# Patient Record
Sex: Female | Born: 2000 | Race: Black or African American | Hispanic: No | Marital: Single | Smoking: Never smoker
Health system: Southern US, Community
[De-identification: ages and names within clinical notes are randomized; demographics above are authoritative.]

## PROBLEM LIST (undated history)

## (undated) DIAGNOSIS — J45909 Unspecified asthma, uncomplicated: Secondary | ICD-10-CM

---
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
## 2021-06-22 ENCOUNTER — Ambulatory Visit: Payer: Medicaid Other

## 2021-12-28 ENCOUNTER — Other Ambulatory Visit: Payer: Self-pay

## 2021-12-28 ENCOUNTER — Emergency Department
Admission: EM | Admit: 2021-12-28 | Discharge: 2021-12-28 | Disposition: A | Discharge status: Home / Self Care | Payer: Medicaid Other | Attending: Emergency Medicine | Admitting: Emergency Medicine

## 2021-12-28 ENCOUNTER — Encounter: Payer: Self-pay | Admitting: Emergency Medicine

## 2021-12-28 DIAGNOSIS — N764 Abscess of vulva: Secondary | ICD-10-CM | POA: Diagnosis not present

## 2021-12-28 DIAGNOSIS — L0291 Cutaneous abscess, unspecified: Secondary | ICD-10-CM

## 2021-12-28 MED ORDER — DOXYCYCLINE MONOHYDRATE 100 MG PO TABS: 100.0000 mg | ORAL_TABLET | Freq: Two times a day (BID) | ORAL | 0 refills | Status: DC

## 2021-12-28 MED ORDER — CEPHALEXIN 500 MG PO CAPS: 500.0000 mg | ORAL_CAPSULE | Freq: Four times a day (QID) | ORAL | 0 refills | Status: AC

## 2021-12-28 MED ORDER — LIDOCAINE-EPINEPHRINE 2 %-1:100000 IJ SOLN
20.0000 mL | Freq: Once | INTRAMUSCULAR | Status: AC
  Administered 2021-12-28: 20 mL via INTRADERMAL
  Filled 2021-12-28: qty 1

## 2021-12-28 NOTE — Discharge Instructions (Addendum)
The wick should be removed in 2 days as we discussed.  Please take the antibiotics as prescribed.  Please return for any new, worsening, or change in symptoms or other concerns.

## 2021-12-28 NOTE — ED Provider Triage Note (Signed)
Emergency Medicine Provider Triage Evaluation Note  SMERA GUYETTE , a 21 y.o. female  was evaluated in triage.  Pt complains of groin abscess.  Review of Systems  Positive: Abscess Negative: Fever  Physical Exam  There were no vitals taken for this visit. Gen:   Awake, no distress   Resp:  Normal effort  MSK:   Moves extremities without difficulty  Other:    Medical Decision Making  Medically screening exam initiated at 12:59 PM.  Appropriate orders placed.  SINDEE STUCKER was informed that the remainder of the evaluation will be completed by another provider, this initial triage assessment does not replace that evaluation, and the importance of remaining in the ED until their evaluation is complete.  Will need I&D   Jene Every, MD 12/28/21 1527

## 2021-12-28 NOTE — ED Triage Notes (Signed)
Pt via POV from home. Pt c/o abscess on L thigh states she noticed it 2 days ago but started hurting today. Denies fever.

## 2021-12-28 NOTE — ED Provider Notes (Signed)
[facility] Provider Note    Event Date/Time   First MD Initiated Contact with Patient 12/28/21 1353     (approximate)   History   Abscess   HPI  Susan Maddox is a 21 y.o. female who presents today for evaluation of abscess.  Patient reports that she had an ingrown hair in her bikini line which she tried to shave and it has since gotten infected.  She has been applying warm compresses and it has come to a head.  She denies fevers or chills.     Physical Exam   Triage Vital Signs: ED Triage Vitals  Enc Vitals Group     BP 12/28/21 1302 (!) 149/72     Pulse Rate 12/28/21 1302 (!) 106     Resp 12/28/21 1302 18     Temp 12/28/21 1302 98.5 F (36.9 C)     Temp src --      SpO2 12/28/21 1302 100 %     Weight --      Height 12/28/21 1300 5\' 7"  (1.702 m)     Head Circumference --      Peak Flow --      Pain Score 12/28/21 1300 8     Pain Loc --      Pain Edu? --      Excl. in GC? --     Most recent vital signs: Vitals:   12/28/21 1446 12/28/21 1501  BP: 140/70 129/75  Pulse: 98 87  Resp: 18 16  Temp:    SpO2: 100% 99%    Physical Exam Vitals and nursing note reviewed.  Constitutional:      General: Awake and alert. No acute distress.    Appearance: Normal appearance. She is well-developed and normal weight.  HENT:     Head: Normocephalic and atraumatic.     Mouth: Mucous membranes are moist.  Eyes:     General: PERRL. Normal EOMs        Right eye: No discharge.        Left eye: No discharge.     Conjunctiva/sclera: Conjunctivae normal.  Cardiovascular:     Rate and Rhythm: Normal rate and regular rhythm.     Pulses: Normal pulses.  Pulmonary:     Effort: Pulmonary effort is normal. No respiratory distress.  Abdominal:     Abdomen is soft. There is no abdominal tenderness.  Musculoskeletal:        General: No swelling. Normal range of motion.     Cervical back: Normal range of motion and neck supple.  Skin:    General:  Skin is warm and dry.     Capillary Refill: Capillary refill takes less than 2 seconds.     Findings: No rash.  Left groin area: Normal external genitalia.  Shaved pubic hair. Outside left labia with 1x1cm area of fluctuance and whitened head.  Neurological:     Mental Status: She is alert.      ED Results / Procedures / Treatments   Labs (all labs ordered are listed, but only abnormal results are displayed) Labs Reviewed - No data to display   EKG     RADIOLOGY     PROCEDURES:  Critical Care performed:   06/22/23Marland KitchenIncision and Drainage  Date/Time: 12/28/2021 2:30 PM  Performed by: 12/30/2021, PA-C Authorized by: Jackelyn Hoehn, PA-C   Consent:    Consent obtained:  Verbal   Consent given by:  Patient   Risks,  benefits, and alternatives were discussed: yes     Risks discussed:  Bleeding, damage to other organs, incomplete drainage, infection and pain   Alternatives discussed:  No treatment Universal protocol:    Procedure explained and questions answered to patient or proxy's satisfaction: yes     Relevant documents present and verified: yes     Test results available : yes     Required blood products, implants, devices, and special equipment available: yes     Site/side marked: yes     Immediately prior to procedure, a time out was called: yes     Patient identity confirmed:  Verbally with patient Location:    Type:  Abscess   Size:  2x1cm   Location:  Anogenital   Anogenital location:  Vulva Pre-procedure details:    Skin preparation:  Antiseptic wash Sedation:    Sedation type:  None Anesthesia:    Anesthesia method:  Local infiltration   Local anesthetic:  Lidocaine 1% WITH epi Procedure type:    Complexity:  Simple Procedure details:    Ultrasound guidance: no     Incision types:  Single straight   Incision depth:  Dermal   Wound management:  Probed and deloculated and irrigated with saline   Drainage:  Purulent   Drainage amount:  Copious    Wound treatment:  Drain placed   Packing materials:  1/4 in iodoform gauze Post-procedure details:    Procedure completion:  Tolerated well, no immediate complications    MEDICATIONS ORDERED IN ED: Medications  lidocaine-EPINEPHrine (XYLOCAINE W/EPI) 2 %-1:100000 (with pres) injection 20 mL (20 mLs Intradermal Given by Other 12/28/21 1404)     IMPRESSION / MDM / ASSESSMENT AND PLAN / ED COURSE  I reviewed the triage vital signs and the nursing notes.   Differential diagnosis includes, but is not limited to, folliculitis, furuncle, carbuncle, abscess. Not at the bartholin gland. Patient is awake and alert, hemodynamically stable and afebrile.  Area appears to have begun as an ingrown hair and turned into abscess.  It is outside the labia, not consistent with Bartholin's cyst/abscess.  Area was anesthetized with lidocaine and opened with an 11 blade.  Copious purulent discharge noted.  Pocket was deloculated and irrigated.  Small wick was placed.  We discussed with removal in 2 days and outpatient follow-up for wound recheck.  She was started on antibiotics.  We discussed tricked return precautions and the importance of close outpatient follow-up.  Patient understands and agrees with plan.  Discharged in stable condition.   Patient's presentation is most consistent with acute illness / injury with system symptoms.    FINAL CLINICAL IMPRESSION(S) / ED DIAGNOSES   Final diagnoses:  Abscess     Rx / DC Orders   ED Discharge Orders          Ordered    cephALEXin (KEFLEX) 500 MG capsule  4 times daily        12/28/21 1422    doxycycline (ADOXA) 100 MG tablet  2 times daily        12/28/21 1422             Note:  This document was prepared using Dragon voice recognition software and may include unintentional dictation errors.   Susan Maddox 12/28/21 1910    Chesley Noon, MD 12/29/21 [phone]

## 2023-04-22 ENCOUNTER — Inpatient Hospital Stay
Admission: EM | Admit: 2023-04-22 | Discharge: 2023-04-23 | DRG: 203 | Disposition: A | Discharge status: Home / Self Care | Payer: 59 | Attending: Internal Medicine | Admitting: Internal Medicine

## 2023-04-22 ENCOUNTER — Emergency Department: Payer: 59

## 2023-04-22 ENCOUNTER — Other Ambulatory Visit: Payer: Self-pay

## 2023-04-22 DIAGNOSIS — J4541 Moderate persistent asthma with (acute) exacerbation: Secondary | ICD-10-CM | POA: Diagnosis not present

## 2023-04-22 DIAGNOSIS — R1110 Vomiting, unspecified: Secondary | ICD-10-CM | POA: Diagnosis present

## 2023-04-22 DIAGNOSIS — F17290 Nicotine dependence, other tobacco product, uncomplicated: Secondary | ICD-10-CM | POA: Diagnosis present

## 2023-04-22 DIAGNOSIS — Z79899 Other long term (current) drug therapy: Secondary | ICD-10-CM

## 2023-04-22 DIAGNOSIS — Z9109 Other allergy status, other than to drugs and biological substances: Secondary | ICD-10-CM | POA: Insufficient documentation

## 2023-04-22 DIAGNOSIS — Z1152 Encounter for screening for COVID-19: Secondary | ICD-10-CM | POA: Diagnosis not present

## 2023-04-22 DIAGNOSIS — R0602 Shortness of breath: Secondary | ICD-10-CM | POA: Diagnosis not present

## 2023-04-22 DIAGNOSIS — R079 Chest pain, unspecified: Secondary | ICD-10-CM | POA: Diagnosis not present

## 2023-04-22 DIAGNOSIS — J209 Acute bronchitis, unspecified: Secondary | ICD-10-CM | POA: Diagnosis not present

## 2023-04-22 DIAGNOSIS — E876 Hypokalemia: Secondary | ICD-10-CM | POA: Diagnosis not present

## 2023-04-22 DIAGNOSIS — J45901 Unspecified asthma with (acute) exacerbation: Principal | ICD-10-CM | POA: Diagnosis present

## 2023-04-22 LAB — CBC WITH DIFFERENTIAL/PLATELET
Abs Immature Granulocytes: 0.05 10*3/uL (ref 0.00–0.07)
Basophils Absolute: 0.1 10*3/uL (ref 0.0–0.1)
Basophils Relative: 1 %
Eosinophils Absolute: 0.2 10*3/uL (ref 0.0–0.5)
Eosinophils Relative: 1 %
HCT: 42.3 % (ref 36.0–46.0)
Hemoglobin: 13.5 g/dL (ref 12.0–15.0)
Immature Granulocytes: 0 %
Lymphocytes Relative: 14 %
Lymphs Abs: 2 10*3/uL (ref 0.7–4.0)
MCH: 27.4 pg (ref 26.0–34.0)
MCHC: 31.9 g/dL (ref 30.0–36.0)
MCV: 86 fL (ref 80.0–100.0)
Monocytes Absolute: 1.1 10*3/uL — ABNORMAL HIGH (ref 0.1–1.0)
Monocytes Relative: 7 %
Neutro Abs: 10.9 10*3/uL — ABNORMAL HIGH (ref 1.7–7.7)
Neutrophils Relative %: 77 %
Platelets: 285 10*3/uL (ref 150–400)
RBC: 4.92 MIL/uL (ref 3.87–5.11)
RDW: 13.3 % (ref 11.5–15.5)
WBC: 14.3 10*3/uL — ABNORMAL HIGH (ref 4.0–10.5)
nRBC: 0 % (ref 0.0–0.2)

## 2023-04-22 LAB — BASIC METABOLIC PANEL
Anion gap: 12 (ref 5–15)
BUN: 12 mg/dL (ref 6–20)
CO2: 24 mmol/L (ref 22–32)
Calcium: 8.9 mg/dL (ref 8.9–10.3)
Chloride: 103 mmol/L (ref 98–111)
Creatinine, Ser: 0.85 mg/dL (ref 0.44–1.00)
GFR, Estimated: >60 mL/min (ref >60)
Glucose, Bld: 127 mg/dL — ABNORMAL HIGH (ref 70–99)
Potassium: 3 mmol/L — ABNORMAL LOW (ref 3.5–5.1)
Sodium: 139 mmol/L (ref 135–145)

## 2023-04-22 LAB — RESP PANEL BY RT-PCR (RSV, FLU A&B, COVID)  RVPGX2
Influenza A by PCR: NEGATIVE
Influenza B by PCR: NEGATIVE
Resp Syncytial Virus by PCR: NEGATIVE
SARS Coronavirus 2 by RT PCR: NEGATIVE

## 2023-04-22 LAB — POC URINE PREG, ED: Preg Test, Ur: NEGATIVE

## 2023-04-22 LAB — TROPONIN I (HIGH SENSITIVITY): Troponin I (High Sensitivity): 5 ng/L (ref <18)

## 2023-04-22 MED ORDER — POTASSIUM CHLORIDE CRYS ER 20 MEQ PO TBCR
40.0000 meq | EXTENDED_RELEASE_TABLET | Freq: Once | ORAL | Status: AC
  Administered 2023-04-22: 40 meq via ORAL
  Filled 2023-04-22: qty 2

## 2023-04-22 MED ORDER — IPRATROPIUM-ALBUTEROL 0.5-2.5 (3) MG/3ML IN SOLN
3.0000 mL | Freq: Once | RESPIRATORY_TRACT | Status: AC
  Administered 2023-04-22: 3 mL via RESPIRATORY_TRACT
  Filled 2023-04-22: qty 3

## 2023-04-22 MED ORDER — IBUPROFEN 400 MG PO TABS
400.0000 mg | ORAL_TABLET | Freq: Once | ORAL | Status: AC
Start: 2023-04-22 — End: 2023-04-22
  Administered 2023-04-22: 400 mg via ORAL
  Filled 2023-04-22: qty 1

## 2023-04-22 NOTE — ED Provider Notes (Signed)
[facility] Provider Note    Event Date/Time   First MD Initiated Contact with Patient 04/22/23 2059     (approximate)   History   Chief Complaint Respiratory Distress   HPI  Susan Maddox is a 22 y.o. female with past medical history of asthma who presents to the ED complaining of shortness of breath.  Patient reports that her asthma has been "acting up" for the past couple of days with intermittent cough and difficulty breathing.  Patient reports that her breathing got especially bad this evening while she was at work.  EMS was called and reportedly found patient in respiratory distress with oxygen saturations in the 70s.  She was given 3 albuterol treatments, 2 DuoNebs, 125 mg IV Solu-Medrol, 2 g IV magnesium, and 4 mg IV Zofran.  She now reports that her breathing is significantly better, does report some mild ongoing difficulty breathing with tightness in her chest.  She denies any fevers and has not had any pain or swelling in her legs.     Physical Exam   Triage Vital Signs: ED Triage Vitals  Encounter Vitals Group     BP 04/22/23 2045 126/74     Systolic BP Percentile --      Diastolic BP Percentile --      Pulse Rate 04/22/23 2045 (!) 101     Resp 04/22/23 2045 16     Temp 04/22/23 2045 (!) 97.5 F (36.4 C)     Temp Source 04/22/23 2045 Oral     SpO2 04/22/23 2045 93 %     Weight 04/22/23 2047 190 lb 6.4 oz (86.4 kg)     Height 04/22/23 2047 5\' 7"  (1.702 m)     Head Circumference --      Peak Flow --      Pain Score 04/22/23 2046 8     Pain Loc --      Pain Education --      Exclude from Growth Chart --     Most recent vital signs: Vitals:   04/22/23 2050 04/22/23 2130  BP:  131/77  Pulse: 97 (!) 102  Resp: 17 (!) 21  Temp:    SpO2: 93% 95%    Constitutional: Alert and oriented. Eyes: Conjunctivae are normal. Head: Atraumatic. Nose: No congestion/rhinnorhea. Mouth/Throat: Mucous membranes are moist.  Cardiovascular:  Normal rate, regular rhythm. Grossly normal heart sounds.  2+ radial pulses bilaterally. Respiratory: Normal respiratory effort.  No retractions. Lungs with inspiratory expiratory wheezing throughout. Gastrointestinal: Soft and nontender. No distention. Musculoskeletal: No lower extremity tenderness nor edema.  Neurologic:  Normal speech and language. No gross focal neurologic deficits are appreciated.    ED Results / Procedures / Treatments   Labs (all labs ordered are listed, but only abnormal results are displayed) Labs Reviewed  CBC WITH DIFFERENTIAL/PLATELET - Abnormal; Notable for the following components:      Result Value   WBC 14.3 (*)    Neutro Abs 10.9 (*)    Monocytes Absolute 1.1 (*)    All other components within normal limits  BASIC METABOLIC PANEL - Abnormal; Notable for the following components:   Potassium 3.0 (*)    Glucose, Bld 127 (*)    All other components within normal limits  POC URINE PREG, ED  TROPONIN I (HIGH SENSITIVITY)     EKG  ED ECG REPORT I, Chesley Noon, the attending physician, personally viewed and interpreted this ECG.   Date: 04/22/2023  EKG Time:  20:55  Rate: 101  Rhythm: sinus tachycardia  Axis: Normal  Intervals:none  ST&T Change: None  RADIOLOGY Chest x-ray reviewed and interpreted by me with no infiltrate, edema, or effusion.  PROCEDURES:  Critical Care performed: No  Procedures   MEDICATIONS ORDERED IN ED: Medications  ibuprofen (ADVIL) tablet 400 mg (has no administration in time range)  ipratropium-albuterol (DUONEB) 0.5-2.5 (3) MG/3ML nebulizer solution 3 mL (has no administration in time range)  potassium chloride SA (KLOR-CON M) CR tablet 40 mEq (40 mEq Oral Given 04/22/23 2227)  ipratropium-albuterol (DUONEB) 0.5-2.5 (3) MG/3ML nebulizer solution 3 mL (3 mLs Nebulization Given 04/22/23 2228)     IMPRESSION / MDM / ASSESSMENT AND PLAN / ED COURSE  I reviewed the triage vital signs and the nursing notes.                               22 y.o. female with past medical history of asthma presents to the ED with increasing difficulty breathing and chest tightness this evening.  Patient's presentation is most consistent with acute presentation with potential threat to life or bodily function.  Differential diagnosis includes, but is not limited to, ACS, PE, pneumonia, pneumothorax, asthma exacerbation, anemia, electrolyte abnormality, AKI.  Patient well-appearing and in no acute distress, vital signs are reassuring on arrival.  She reportedly had low oxygen saturations with EMS, currently maintaining oxygen saturations on room air here in the ED.  She does have ongoing wheezing but no increased work of breathing.  We will further assess with chest x-ray and labs, EKG shows no evidence of arrhythmia or ischemia.  Labs are reassuring with mild leukocytosis but no significant anemia, electrolyte abnormality, or AKI.  Troponin within normal limits and I doubt ACS or PE.  Chest x-ray unremarkable, but patient has significant recurrent wheezing on reassessment.  She was given a DuoNeb with some improvement but continues to have significant wheezing with increased work of breathing and mild tachypnea.  We will give additional albuterol, case discussed with hospitalist for admission.      FINAL CLINICAL IMPRESSION(S) / ED DIAGNOSES   Final diagnoses:  Exacerbation of persistent asthma, unspecified asthma severity     Rx / DC Orders   ED Discharge Orders     None        Note:  This document was prepared using Dragon voice recognition software and may include unintentional dictation errors.   Chesley Noon, MD 04/22/23 [phone]

## 2023-04-22 NOTE — ED Triage Notes (Signed)
Patient arrives from home by [facility] with respiratory distress. EMS reports room air saturation in the 70's with expiratory wheezes and retractions.  Patient has received 3 albuterol treatments, 2 duo-nebs, 125mg  solumedrol, 2G Magnesium, and 4mg  zofran.  Patient did 2 treatments at home prior to EMS.  Patient last smoke black&mild @ 1600 today.

## 2023-04-23 DIAGNOSIS — Z9109 Other allergy status, other than to drugs and biological substances: Secondary | ICD-10-CM | POA: Insufficient documentation

## 2023-04-23 DIAGNOSIS — J45901 Unspecified asthma with (acute) exacerbation: Secondary | ICD-10-CM

## 2023-04-23 DIAGNOSIS — E876 Hypokalemia: Secondary | ICD-10-CM

## 2023-04-23 DIAGNOSIS — R1110 Vomiting, unspecified: Secondary | ICD-10-CM

## 2023-04-23 DIAGNOSIS — F17290 Nicotine dependence, other tobacco product, uncomplicated: Secondary | ICD-10-CM | POA: Insufficient documentation

## 2023-04-23 LAB — BASIC METABOLIC PANEL
Anion gap: 9 (ref 5–15)
BUN: 11 mg/dL (ref 6–20)
CO2: 25 mmol/L (ref 22–32)
Calcium: 9.2 mg/dL (ref 8.9–10.3)
Chloride: 102 mmol/L (ref 98–111)
Creatinine, Ser: 0.93 mg/dL (ref 0.44–1.00)
GFR, Estimated: >60 mL/min (ref >60)
Glucose, Bld: 167 mg/dL — ABNORMAL HIGH (ref 70–99)
Potassium: 4.2 mmol/L (ref 3.5–5.1)
Sodium: 136 mmol/L (ref 135–145)

## 2023-04-23 LAB — CBC
HCT: 41.4 % (ref 36.0–46.0)
Hemoglobin: 13.4 g/dL (ref 12.0–15.0)
MCH: 27.4 pg (ref 26.0–34.0)
MCHC: 32.4 g/dL (ref 30.0–36.0)
MCV: 84.7 fL (ref 80.0–100.0)
Platelets: 303 10*3/uL (ref 150–400)
RBC: 4.89 MIL/uL (ref 3.87–5.11)
RDW: 13.4 % (ref 11.5–15.5)
WBC: 8.8 10*3/uL (ref 4.0–10.5)
nRBC: 0 % (ref 0.0–0.2)

## 2023-04-23 LAB — HIV ANTIBODY (ROUTINE TESTING W REFLEX): HIV Screen 4th Generation wRfx: NONREACTIVE

## 2023-04-23 LAB — POTASSIUM: Potassium: 4.3 mmol/L (ref 3.5–5.1)

## 2023-04-23 MED ORDER — GUAIFENESIN ER 600 MG PO TB12
600.0000 mg | ORAL_TABLET | Freq: Two times a day (BID) | ORAL | Status: DC
  Administered 2023-04-23 (×2): 600 mg via ORAL
  Filled 2023-04-23 (×2): qty 1

## 2023-04-23 MED ORDER — ONDANSETRON HCL 4 MG/2ML IJ SOLN
4.0000 mg | Freq: Four times a day (QID) | INTRAMUSCULAR | Status: DC | PRN
  Administered 2023-04-23: 4 mg via INTRAVENOUS
  Filled 2023-04-23: qty 2

## 2023-04-23 MED ORDER — PREDNISONE 20 MG PO TABS: 40.0000 mg | ORAL_TABLET | Freq: Every day | ORAL | 0 refills | Status: DC

## 2023-04-23 MED ORDER — IPRATROPIUM-ALBUTEROL 0.5-2.5 (3) MG/3ML IN SOLN
3.0000 mL | Freq: Four times a day (QID) | RESPIRATORY_TRACT | Status: DC
Start: 2023-04-23 — End: 2023-04-23
  Administered 2023-04-23: 3 mL via RESPIRATORY_TRACT
  Filled 2023-04-23: qty 3

## 2023-04-23 MED ORDER — LORATADINE 10 MG PO TABS
10.0000 mg | ORAL_TABLET | Freq: Every day | ORAL | Status: DC
  Administered 2023-04-23: 10 mg via ORAL
  Filled 2023-04-23: qty 1

## 2023-04-23 MED ORDER — METHYLPREDNISOLONE SODIUM SUCC 40 MG IJ SOLR
40.0000 mg | Freq: Two times a day (BID) | INTRAMUSCULAR | Status: DC
  Administered 2023-04-23: 40 mg via INTRAVENOUS
  Filled 2023-04-23: qty 1

## 2023-04-23 MED ORDER — ACETAMINOPHEN 325 MG PO TABS
650.0000 mg | ORAL_TABLET | Freq: Four times a day (QID) | ORAL | Status: DC | PRN
  Administered 2023-04-23: 650 mg via ORAL
  Filled 2023-04-23: qty 2

## 2023-04-23 MED ORDER — PREDNISONE 20 MG PO TABS: 40.0000 mg | ORAL_TABLET | Freq: Every day | ORAL | Status: DC

## 2023-04-23 MED ORDER — ALBUTEROL SULFATE (2.5 MG/3ML) 0.083% IN NEBU
2.5000 mg | INHALATION_SOLUTION | RESPIRATORY_TRACT | Status: DC | PRN
  Administered 2023-04-23 (×2): 2.5 mg via RESPIRATORY_TRACT
  Filled 2023-04-23 (×2): qty 3

## 2023-04-23 MED ORDER — AZITHROMYCIN 500 MG PO TABS
500.0000 mg | ORAL_TABLET | Freq: Every day | ORAL | Status: AC
  Administered 2023-04-23: 500 mg via ORAL
  Filled 2023-04-23: qty 1

## 2023-04-23 MED ORDER — AZITHROMYCIN 250 MG PO TABS: 250.0000 mg | ORAL_TABLET | Freq: Every day | ORAL | Status: DC

## 2023-04-23 MED ORDER — ACETAMINOPHEN 325 MG RE SUPP: 650.0000 mg | Freq: Four times a day (QID) | RECTAL | Status: DC | PRN

## 2023-04-23 MED ORDER — ONDANSETRON HCL 4 MG PO TABS: 4.0000 mg | ORAL_TABLET | Freq: Four times a day (QID) | ORAL | Status: DC | PRN

## 2023-04-23 MED ORDER — GUAIFENESIN ER 600 MG PO TB12: 600.0000 mg | ORAL_TABLET | Freq: Two times a day (BID) | ORAL | 0 refills | Status: AC

## 2023-04-23 MED ORDER — AZITHROMYCIN 250 MG PO TABS: 250.0000 mg | ORAL_TABLET | Freq: Every day | ORAL | 0 refills | Status: AC

## 2023-04-23 MED ORDER — FLUTICASONE-SALMETEROL 45-21 MCG/ACT IN AERO: 2.0000 | INHALATION_SPRAY | Freq: Two times a day (BID) | RESPIRATORY_TRACT | 12 refills | Status: AC

## 2023-04-23 NOTE — Assessment & Plan Note (Addendum)
Possible acute bronchitis Environmental allergies Tobacco use-cigars 3 weeks of wheezing unresolved with home nebulizers, becoming acutely severe on day of arrival, O2 sats 70s with EMS requiring CPAP  Respiratory viral panel negative for COVID flu and RSV and chest x-ray clear O2 sat in 90s on arrival Schedule and as needed albuterol IV steroids Antitussives, loratadine for allergies Azithromycin for anti-inflammatory effect and possible bronchitis Incentive spirometer Tobacco cessation counseling Patient might need controller medication at discharge for moderate persistent asthma

## 2023-04-23 NOTE — ED Notes (Signed)
RN called lab, who states they will send someone to come and collect blood work

## 2023-04-23 NOTE — ED Notes (Signed)
Pt states coming in due to asthma attacks. Pt in bed with cardiac, bp and pulse ox monitor.

## 2023-04-23 NOTE — Assessment & Plan Note (Signed)
Suspect related to increased coughing IV antiemetics Continue to monitor Oral rehydration monitor antiemetics take effect

## 2023-04-23 NOTE — H&P (Addendum)
History and Physical    Patient: Susan Maddox:[id number] DOB: [birth year] DOA: 04/22/2023 DOS: the patient was seen and examined on 04/23/2023 [facility]CP: [facility]  Patient coming from: Home  Chief Complaint:  Chief Complaint  Patient presents with   Respiratory Distress    HPI: Susan Maddox is a 22 y.o. female with medical history significant for Asthma, tobacco use, hospitalized once for asthma at age 4, no intubations, who presents to the ED with a severe asthma exacerbation.  Patient states she has been wheezing for the past 3 weeks and has been using her inhaler and nebulizers more frequently.  At baseline she uses it 3 to 4 days out of the week.  States dust, especially at the workplace triggers her attacks.  While at work today she started wheezing and had no response to her inhaler.    O2 sat on arrival of EMS was reportedly in the 70s and was placed on CPAP.  She received 2 DuoNebs, Solu-Medrol and magnesium with EMS.  She vomited yellowish gastric contents with EMS and had another episode of vomiting following arrival in the ED.  Patient denies fever, chills.  Was coughing and feels chest tightness associated with wheezing.  Parents at bedside. ED course and data review: Tachycardic to the low 100s on arrival with respirations 21 with otherwise normal vitals.  O2 sats remained 93-95 on room air in the ED. Labs notable for WBC 14,000, potassium of 3. Respiratory viral panel negative for COVID flu and RSV EKG, personally viewed and interpreted showing sinus tachycardia at 100 with no acute ST-T wave changes Chest x-ray with no active disease Patient was treated with an additional 3 DuoNebs and given oral potassium repletion, Zofran, Mucinex She improved with treatment but continued to have significant wheezing. Hospitalist consulted for admission.   Review of Systems: As mentioned in the history of present illness. All other systems reviewed and are  negative.  Past Medical History:  Diagnosis Date   Asthma    History reviewed. No pertinent surgical history. Social History:  reports that she has been smoking cigars. She has never used smokeless tobacco. No history on file for alcohol use and drug use.  No Known Allergies  History reviewed. No pertinent family history.  Prior to Admission medications   Medication Sig Start Date End Date Taking? Authorizing Provider  doxycycline (ADOXA) 100 MG tablet Take 1 tablet (100 mg total) by mouth 2 (two) times daily. 12/28/21   Jackelyn Hoehn, PA-C    Physical Exam: Vitals:   04/22/23 2045 04/22/23 2047 04/22/23 2050 04/22/23 2130  BP: 126/74   131/77  Pulse: (!) 101  97 (!) 102  Resp: 16  17 (!) 21  Temp: (!) 97.5 F (36.4 C)     TempSrc: Oral     SpO2: 93%  93% 95%  Weight:  86.4 kg    Height:  5\' 7"  (1.702 m)     Physical Exam Vitals and nursing note reviewed.  Constitutional:      General: She is not in acute distress. HENT:     Head: Normocephalic and atraumatic.  Cardiovascular:     Rate and Rhythm: Normal rate and regular rhythm.     Heart sounds: Normal heart sounds.  Pulmonary:     Effort: Prolonged expiration present.     Breath sounds: Decreased air movement present. Wheezing and rhonchi present.  Abdominal:     Palpations: Abdomen is soft.  Tenderness: There is no abdominal tenderness.  Neurological:     Mental Status: Mental status is at baseline.     Labs on Admission: I have personally reviewed following labs and imaging studies  CBC: Recent Labs  Lab 04/22/23 2113  WBC 14.3*  NEUTROABS 10.9*  HGB 13.5  HCT 42.3  MCV 86.0  PLT 285   Basic Metabolic Panel: Recent Labs  Lab 04/22/23 2113  NA 139  K 3.0*  CL 103  CO2 24  GLUCOSE 127*  BUN 12  CREATININE 0.85  CALCIUM 8.9   GFR: Estimated Creatinine Clearance: 116.2 mL/min (by C-G formula based on SCr of 0.85 mg/dL). Liver Function Tests: No results for input(s): "AST", "ALT",  "ALKPHOS", "BILITOT", "PROT", "ALBUMIN" in the last 168 hours. No results for input(s): "LIPASE", "AMYLASE" in the last 168 hours. No results for input(s): "AMMONIA" in the last 168 hours. Coagulation Profile: No results for input(s): "INR", "PROTIME" in the last 168 hours. Cardiac Enzymes: No results for input(s): "CKTOTAL", "CKMB", "CKMBINDEX", "TROPONINI" in the last 168 hours. BNP (last 3 results) No results for input(s): "PROBNP" in the last 8760 hours. HbA1C: No results for input(s): "HGBA1C" in the last 72 hours. CBG: No results for input(s): "GLUCAP" in the last 168 hours. Lipid Profile: No results for input(s): "CHOL", "HDL", "LDLCALC", "TRIG", "CHOLHDL", "LDLDIRECT" in the last 72 hours. Thyroid Function Tests: No results for input(s): "TSH", "T4TOTAL", "FREET4", "T3FREE", "THYROIDAB" in the last 72 hours. Anemia Panel: No results for input(s): "VITAMINB12", "FOLATE", "FERRITIN", "TIBC", "IRON", "RETICCTPCT" in the last 72 hours. Urine analysis: No results found for: "COLORURINE", "APPEARANCEUR", "LABSPEC", "PHURINE", "GLUCOSEU", "HGBUR", "BILIRUBINUR", "KETONESUR", "PROTEINUR", "UROBILINOGEN", "NITRITE", "LEUKOCYTESUR"  Radiological Exams on Admission: DG Chest Portable 1 View  Result Date: 04/22/2023 CLINICAL DATA:  Chest pain EXAM: PORTABLE CHEST 1 VIEW COMPARISON:  Radiograph 07/16/2005 FINDINGS: The heart size and mediastinal contours are within normal limits. Both lungs are clear. The visualized skeletal structures are unremarkable. IMPRESSION: No active disease. Electronically Signed   By: Minerva Fester M.D.   On: 04/22/2023 21:48     Data Reviewed: Relevant notes from primary care and specialist visits, past discharge summaries as available in EHR, including Care Everywhere. Prior diagnostic testing as pertinent to current admission diagnoses Updated medications and problem lists for reconciliation ED course, including vitals, labs, imaging, treatment and response  to treatment Triage notes, nursing and pharmacy notes and ED provider's notes Notable results as noted in HPI   Assessment and Plan: * Moderate persistent asthma with severe exacerbation Possible acute bronchitis Environmental allergies Tobacco use-cigars 3 weeks of wheezing unresolved with home nebulizers, becoming acutely severe on day of arrival, O2 sats 70s with EMS requiring CPAP  Respiratory viral panel negative for COVID flu and RSV and chest x-ray clear O2 sat in 90s on arrival Schedule and as needed albuterol IV steroids Antitussives, loratadine for allergies Azithromycin for anti-inflammatory effect and possible bronchitis Incentive spirometer Tobacco cessation counseling Patient might need controller medication at discharge for moderate persistent asthma  Vomiting Suspect related to increased coughing IV antiemetics Continue to monitor Oral rehydration monitor antiemetics take effect   Hypokalemia Possibly from episodes of vomiting Received repletion in the ED Continue to monitor and replete as needed    DVT prophylaxis: early ambulation  Consults: none  Advance Care Planning:   Code Status: Full Code   Family Communication: Mother and father at bedside  Disposition Plan: Back to previous home environment  Severity of Illness: The appropriate patient status for  this patient is INPATIENT. Inpatient status is judged to be reasonable and necessary in order to provide the required intensity of service to ensure the patient's safety. The patient's presenting symptoms, physical exam findings, and initial radiographic and laboratory data in the context of their chronic comorbidities is felt to place them at high risk for further clinical deterioration. Furthermore, it is not anticipated that the patient will be medically stable for discharge from the hospital within 2 midnights of admission.   * I certify that at the point of admission it is my clinical judgment  that the patient will require inpatient hospital care spanning beyond 2 midnights from the point of admission due to high intensity of service, high risk for further deterioration and high frequency of surveillance required.*  Author: Andris Baumann, MD 04/23/2023 12:17 AM  For on call review [email or web address].

## 2023-04-23 NOTE — Assessment & Plan Note (Addendum)
Possibly from episodes of vomiting Received repletion in the ED Continue to monitor and replete as needed

## 2023-04-23 NOTE — Discharge Summary (Signed)
Physician Discharge Summary   Patient: Susan Maddox MRN: [id number] DOB: [birth year]  Admit date:     04/22/2023  Discharge date: 04/23/23  Discharge Physician: Loyce Dys   [facility]P: [facility]   Recommendations at discharge:  Follow-up with primary care physician as well as pulmonologist  Discharge Diagnoses: Moderate persistent asthma with severe exacerbation Possible acute bronchitis Environmental allergies Tobacco use-cigars Vomiting Hypokalemia-resolved  Hospital Course: Susan Maddox is a 22 y.o. female with medical history significant for Asthma, tobacco use, hospitalized once for asthma at age 46, no intubations, who presents to the ED with a severe asthma exacerbation.  Patient states she has been wheezing for the past 3 weeks and has been using her inhaler and nebulizers more frequently.  At baseline she uses it 3 to 4 days out of the week.  States dust, especially at the workplace triggers her attacks.  While at work she started wheezing and had no response to her inhaler.    O2 sat on arrival of EMS was reportedly in the 70s and was placed on CPAP.  She received 2 DuoNebs, Solu-Medrol and magnesium with EMS.  ED course and data review: Tachycardic to the low 100s on arrival with respirations 21 with otherwise normal vitals.  O2 sats remained 93-95 on room air in the ED. Labs notable for WBC 14,000, potassium of 3. Respiratory viral panel negative for COVID flu and RSV EKG, personally viewed and interpreted showing sinus tachycardia at 100 with no acute ST-T wave changes Chest x-ray with no active disease Patient unexpectedly improved rapidly overnight and currently she tells me she is almost 100% better.  She was able to walk around and her saturation remained within normal limit.  Went back to evaluate patient again later today and she was doing much better and both patient and mother agreed for discharge home and he will follow-up with primary care  physician.  They have also agreed to follow-up with a pulmonologist.  I have added a maintenance asthma medication in addition to her as needed albuterol.    Consultants: None Procedures performed: None Disposition: Home Diet recommendation:  Regular diet DISCHARGE MEDICATION: Allergies as of 04/23/2023   No Known Allergies      Medication List     STOP taking these medications    doxycycline 100 MG tablet Commonly known as: ADOXA       TAKE these medications    azithromycin 250 MG tablet Commonly known as: ZITHROMAX Take 1 tablet (250 mg total) by mouth daily for 4 days.   fluticasone-salmeterol 45-21 MCG/ACT inhaler Commonly known as: Advair HFA Inhale 2 puffs into the lungs 2 (two) times daily.   guaiFENesin 600 MG 12 hr tablet Commonly known as: MUCINEX Take 1 tablet (600 mg total) by mouth 2 (two) times daily.   predniSONE 20 MG tablet Commonly known as: DELTASONE Take 2 tablets (40 mg total) by mouth daily with breakfast. Start taking on: April 24, 2023   Ventolin HFA 108 (90 Base) MCG/ACT inhaler Generic drug: albuterol Inhale 2-4 puffs into the lungs every 4 (four) hours as needed for wheezing or shortness of breath.   albuterol (2.5 MG/3ML) 0.083% nebulizer solution Commonly known as: PROVENTIL Take 2.5 mg by nebulization every 6 (six) hours as needed for wheezing or shortness of breath.        Discharge Exam: Filed Weights   04/22/23 2047  Weight: 86.4 kg   Vitals and nursing note reviewed.  Constitutional:  General: She is not in acute distress. HENT:     Head: Normocephalic and atraumatic.  Cardiovascular:     Rate and Rhythm: Normal rate and regular rhythm.     Heart sounds: Normal heart sounds.  Pulmonary: Clear to auscultation bilaterally Abdominal:     Palpations: Abdomen is soft.     Tenderness: There is no abdominal tenderness.  Neurological:     Mental Status: Mental status is at baseline.   Condition at discharge:  good  The results of significant diagnostics from this hospitalization (including imaging, microbiology, ancillary and laboratory) are listed below for reference.   Imaging Studies: DG Chest Portable 1 View  Result Date: 04/22/2023 CLINICAL DATA:  Chest pain EXAM: PORTABLE CHEST 1 VIEW COMPARISON:  Radiograph 07/16/2005 FINDINGS: The heart size and mediastinal contours are within normal limits. Both lungs are clear. The visualized skeletal structures are unremarkable. IMPRESSION: No active disease. Electronically Signed   By: Minerva Fester M.D.   On: 04/22/2023 21:48    Microbiology: Results for orders placed or performed during the hospital encounter of 04/22/23  Resp panel by RT-PCR (RSV, Flu A&B, Covid) Anterior Nasal Swab     Status: None   Collection Time: 04/22/23 11:16 PM   Specimen: Anterior Nasal Swab  Result Value Ref Range Status   SARS Coronavirus 2 by RT PCR NEGATIVE NEGATIVE Final    Comment: (NOTE) SARS-CoV-2 target nucleic acids are NOT DETECTED.  The SARS-CoV-2 RNA is generally detectable in upper respiratory specimens during the acute phase of infection. The lowest concentration of SARS-CoV-2 viral copies this assay can detect is 138 copies/mL. A negative result does not preclude SARS-Cov-2 infection and should not be used as the sole basis for treatment or other patient management decisions. A negative result may occur with  improper specimen collection/handling, submission of specimen other than nasopharyngeal swab, presence of viral mutation(s) within the areas targeted by this assay, and inadequate number of viral copies(<138 copies/mL). A negative result must be combined with clinical observations, patient history, and epidemiological information. The expected result is Negative.  Fact Sheet for Patients:  BloggerCourse.com  Fact Sheet for Healthcare Providers:  SeriousBroker.it  This test is no t yet  approved or cleared by the [place] FDA and  has been authorized for detection and/or diagnosis of SARS-CoV-2 by FDA under an Emergency Use Authorization (EUA). This EUA will remain  in effect (meaning this test can be used) for the duration of the COVID-19 declaration under Section 564(b)(1) of the Act, 21 U.S.C.section 360bbb-3(b)(1), unless the authorization is terminated  or revoked sooner.       Influenza A by PCR NEGATIVE NEGATIVE Final   Influenza B by PCR NEGATIVE NEGATIVE Final    Comment: (NOTE) The Xpert Xpress SARS-CoV-2/FLU/RSV plus assay is intended as an aid in the diagnosis of influenza from Nasopharyngeal swab specimens and should not be used as a sole basis for treatment. Nasal washings and aspirates are unacceptable for Xpert Xpress SARS-CoV-2/FLU/RSV testing.  Fact Sheet for Patients: BloggerCourse.com  Fact Sheet for Healthcare Providers: SeriousBroker.it  This test is not yet approved or cleared by the [place] FDA and has been authorized for detection and/or diagnosis of SARS-CoV-2 by FDA under an Emergency Use Authorization (EUA). This EUA will remain in effect (meaning this test can be used) for the duration of the COVID-19 declaration under Section 564(b)(1) of the Act, 21 U.S.C. section 360bbb-3(b)(1), unless the authorization is terminated or revoked.     Resp Syncytial Virus  by PCR NEGATIVE NEGATIVE Final    Comment: (NOTE) Fact Sheet for Patients: BloggerCourse.com  Fact Sheet for Healthcare Providers: SeriousBroker.it  This test is not yet approved or cleared by the [place] FDA and has been authorized for detection and/or diagnosis of SARS-CoV-2 by FDA under an Emergency Use Authorization (EUA). This EUA will remain in effect (meaning this test can be used) for the duration of the COVID-19 declaration under Section 564(b)(1) of  the Act, 21 U.S.C. section 360bbb-3(b)(1), unless the authorization is terminated or revoked.  Performed at [facility], [address] [place], [place] [address]     Labs: CBC: Recent Labs  Lab 04/22/23 2113 04/23/23 1006  WBC 14.3* 8.8  NEUTROABS 10.9*  --   HGB 13.5 13.4  HCT 42.3 41.4  MCV 86.0 84.7  PLT 285 303   Basic Metabolic Panel: Recent Labs  Lab 04/22/23 2113 04/23/23 1006  NA 139 136  K 3.0* 4.2  4.3  CL 103 102  CO2 24 25  GLUCOSE 127* 167*  BUN 12 11  CREATININE 0.85 0.93  CALCIUM 8.9 9.2   Liver Function Tests: No results for input(s): "AST", "ALT", "ALKPHOS", "BILITOT", "PROT", "ALBUMIN" in the last 168 hours. CBG: No results for input(s): "GLUCAP" in the last 168 hours.  Discharge time spent:  35 minutes.  Signed: Loyce Dys, MD [facility] 04/23/2023

## 2023-04-23 NOTE — ED Notes (Signed)
Provider notified: We walked the patient. She become short of breath on the walk with wheezing. O2 dropped to 92%, with an average of 95%

## 2023-04-23 NOTE — Assessment & Plan Note (Deleted)
Loratadine

## 2023-04-23 NOTE — ED Notes (Signed)
Pt states headache is getting worse. RN informed pt he would shut the door so the lights from the hall wont get in. Pt informed if that does not help, to let this RN know.

## 2023-05-07 DIAGNOSIS — R069 Unspecified abnormalities of breathing: Secondary | ICD-10-CM | POA: Diagnosis not present

## 2023-05-07 DIAGNOSIS — E8729 Other acidosis: Secondary | ICD-10-CM | POA: Diagnosis not present

## 2023-05-07 DIAGNOSIS — J9602 Acute respiratory failure with hypercapnia: Secondary | ICD-10-CM | POA: Diagnosis not present

## 2023-05-07 DIAGNOSIS — R062 Wheezing: Secondary | ICD-10-CM | POA: Diagnosis not present

## 2023-05-07 DIAGNOSIS — Z716 Tobacco abuse counseling: Secondary | ICD-10-CM | POA: Diagnosis not present

## 2023-05-07 DIAGNOSIS — Z23 Encounter for immunization: Secondary | ICD-10-CM | POA: Diagnosis not present

## 2023-05-07 DIAGNOSIS — Z789 Other specified health status: Secondary | ICD-10-CM | POA: Diagnosis not present

## 2023-05-07 DIAGNOSIS — Z9989 Dependence on other enabling machines and devices: Secondary | ICD-10-CM | POA: Diagnosis not present

## 2023-05-07 DIAGNOSIS — R0602 Shortness of breath: Secondary | ICD-10-CM | POA: Diagnosis not present

## 2023-05-07 DIAGNOSIS — J9601 Acute respiratory failure with hypoxia: Secondary | ICD-10-CM | POA: Diagnosis not present

## 2023-05-07 DIAGNOSIS — F17210 Nicotine dependence, cigarettes, uncomplicated: Secondary | ICD-10-CM | POA: Diagnosis not present

## 2023-05-07 DIAGNOSIS — I10 Essential (primary) hypertension: Secondary | ICD-10-CM | POA: Diagnosis not present

## 2023-05-07 DIAGNOSIS — J45901 Unspecified asthma with (acute) exacerbation: Secondary | ICD-10-CM | POA: Diagnosis not present

## 2023-05-07 DIAGNOSIS — D72829 Elevated white blood cell count, unspecified: Secondary | ICD-10-CM | POA: Diagnosis not present

## 2023-05-07 DIAGNOSIS — J4521 Mild intermittent asthma with (acute) exacerbation: Secondary | ICD-10-CM | POA: Diagnosis not present

## 2023-05-07 DIAGNOSIS — R739 Hyperglycemia, unspecified: Secondary | ICD-10-CM | POA: Diagnosis not present

## 2023-05-07 DIAGNOSIS — J4551 Severe persistent asthma with (acute) exacerbation: Secondary | ICD-10-CM | POA: Diagnosis not present

## 2023-05-07 DIAGNOSIS — R0603 Acute respiratory distress: Secondary | ICD-10-CM | POA: Diagnosis not present

## 2023-05-07 DIAGNOSIS — J4541 Moderate persistent asthma with (acute) exacerbation: Secondary | ICD-10-CM | POA: Diagnosis not present

## 2023-05-07 DIAGNOSIS — E876 Hypokalemia: Secondary | ICD-10-CM | POA: Diagnosis not present

## 2023-05-07 DIAGNOSIS — Z1152 Encounter for screening for COVID-19: Secondary | ICD-10-CM | POA: Diagnosis not present

## 2023-05-07 DIAGNOSIS — R030 Elevated blood-pressure reading, without diagnosis of hypertension: Secondary | ICD-10-CM | POA: Diagnosis not present

## 2023-05-07 DIAGNOSIS — R000 Tachycardia, unspecified: Secondary | ICD-10-CM | POA: Diagnosis not present

## 2023-05-07 DIAGNOSIS — Z9981 Dependence on supplemental oxygen: Secondary | ICD-10-CM | POA: Diagnosis not present

## 2023-05-10 DIAGNOSIS — R0603 Acute respiratory distress: Secondary | ICD-10-CM | POA: Diagnosis not present

## 2023-05-16 DIAGNOSIS — J45901 Unspecified asthma with (acute) exacerbation: Secondary | ICD-10-CM | POA: Diagnosis not present

## 2023-05-16 DIAGNOSIS — Z1389 Encounter for screening for other disorder: Secondary | ICD-10-CM | POA: Diagnosis not present

## 2023-05-16 DIAGNOSIS — F17200 Nicotine dependence, unspecified, uncomplicated: Secondary | ICD-10-CM | POA: Diagnosis not present

## 2023-07-19 DIAGNOSIS — J4550 Severe persistent asthma, uncomplicated: Secondary | ICD-10-CM | POA: Diagnosis not present

## 2023-08-04 ENCOUNTER — Emergency Department: Payer: 59

## 2023-08-04 ENCOUNTER — Other Ambulatory Visit: Payer: Self-pay

## 2023-08-04 ENCOUNTER — Emergency Department
Admission: EM | Admit: 2023-08-04 | Discharge: 2023-08-05 | Disposition: A | Discharge status: Home / Self Care | Payer: 59 | Attending: Emergency Medicine | Admitting: Emergency Medicine

## 2023-08-04 DIAGNOSIS — R0602 Shortness of breath: Secondary | ICD-10-CM | POA: Diagnosis not present

## 2023-08-04 DIAGNOSIS — R062 Wheezing: Secondary | ICD-10-CM | POA: Diagnosis not present

## 2023-08-04 DIAGNOSIS — J45901 Unspecified asthma with (acute) exacerbation: Secondary | ICD-10-CM | POA: Diagnosis not present

## 2023-08-04 LAB — BASIC METABOLIC PANEL
Anion gap: 13 (ref 5–15)
BUN: 10 mg/dL (ref 6–20)
CO2: 21 mmol/L — ABNORMAL LOW (ref 22–32)
Calcium: 8.8 mg/dL — ABNORMAL LOW (ref 8.9–10.3)
Chloride: 101 mmol/L (ref 98–111)
Creatinine, Ser: 0.77 mg/dL (ref 0.44–1.00)
GFR, Estimated: >60 mL/min (ref >60)
Glucose, Bld: 168 mg/dL — ABNORMAL HIGH (ref 70–99)
Potassium: 2.9 mmol/L — ABNORMAL LOW (ref 3.5–5.1)
Sodium: 135 mmol/L (ref 135–145)

## 2023-08-04 LAB — CBC
HCT: 39.6 % (ref 36.0–46.0)
Hemoglobin: 13.1 g/dL (ref 12.0–15.0)
MCH: 27.2 pg (ref 26.0–34.0)
MCHC: 33.1 g/dL (ref 30.0–36.0)
MCV: 82.3 fL (ref 80.0–100.0)
Platelets: 257 10*3/uL (ref 150–400)
RBC: 4.81 MIL/uL (ref 3.87–5.11)
RDW: 13.6 % (ref 11.5–15.5)
WBC: 13.6 10*3/uL — ABNORMAL HIGH (ref 4.0–10.5)
nRBC: 0 % (ref 0.0–0.2)

## 2023-08-04 MED ORDER — IPRATROPIUM-ALBUTEROL 0.5-2.5 (3) MG/3ML IN SOLN
3.0000 mL | Freq: Once | RESPIRATORY_TRACT | Status: AC
  Administered 2023-08-04: 3 mL via RESPIRATORY_TRACT
  Filled 2023-08-04: qty 3

## 2023-08-04 NOTE — ED Provider Notes (Signed)
[facility] Provider Note    Event Date/Time   First MD Initiated Contact with Patient 08/04/23 2151     (approximate)   History   Shortness of Breath   HPI  Susan Maddox is a 23 y.o. female with a history of asthma who presents with shortness of breath, wheezing.  Patient reports worsening breathing throughout the day became acutely worse while at work.  EMS reports room air sats 85%, gave Solu-Medrol, IV magnesium and nebulizer     Physical Exam   Triage Vital Signs: ED Triage Vitals  Encounter Vitals Group     BP 08/04/23 2151 (!) 141/99     Systolic BP Percentile --      Diastolic BP Percentile --      Pulse Rate 08/04/23 2151 (!) 104     Resp 08/04/23 2151 20     Temp 08/04/23 2151 98.7 F (37.1 C)     Temp src --      SpO2 08/04/23 2151 100 %     Weight 08/04/23 2154 90.7 kg (200 lb)     Height 08/04/23 2154 1.727 m (5\' 8" )     Head Circumference --      Peak Flow --      Pain Score 08/04/23 2153 0     Pain Loc --      Pain Education --      Exclude from Growth Chart --     Most recent vital signs: Vitals:   08/04/23 2151  BP: (!) 141/99  Pulse: (!) 104  Resp: 20  Temp: 98.7 F (37.1 C)  SpO2: 100%     General: Awake, CV:  Good peripheral perfusion.  Resp:   Tachypnea, diffuse wheezing Abd:  No distention.  Other:     ED Results / Procedures / Treatments   Labs (all labs ordered are listed, but only abnormal results are displayed) Labs Reviewed  CBC - Abnormal; Notable for the following components:      Result Value   WBC 13.6 (*)    All other components within normal limits  BASIC METABOLIC PANEL - Abnormal; Notable for the following components:   Potassium 2.9 (*)    CO2 21 (*)    Glucose, Bld 168 (*)    Calcium 8.8 (*)    All other components within normal limits     EKG     RADIOLOGY Chest x-ray viewed interpret by me, no acute abnormality    PROCEDURES:  Critical Care performed:  yes  CRITICAL CARE Performed by: Jene Every   Total critical care time: 30 minutes  Critical care time was exclusive of separately billable procedures and treating other patients.  Critical care was necessary to treat or prevent imminent or life-threatening deterioration.  Critical care was time spent personally by me on the following activities: development of treatment plan with patient and/or surrogate as well as nursing, discussions with consultants, evaluation of patient's response to treatment, examination of patient, obtaining history from patient or surrogate, ordering and performing treatments and interventions, ordering and review of laboratory studies, ordering and review of radiographic studies, pulse oximetry and re-evaluation of patient's condition.   Procedures   MEDICATIONS ORDERED IN ED: Medications  ipratropium-albuterol (DUONEB) 0.5-2.5 (3) MG/3ML nebulizer solution 3 mL (has no administration in time range)  ipratropium-albuterol (DUONEB) 0.5-2.5 (3) MG/3ML nebulizer solution 3 mL (3 mLs Nebulization Given 08/04/23 2202)     IMPRESSION / MDM / ASSESSMENT AND PLAN / ED  COURSE  I reviewed the triage vital signs and the nursing notes. Patient's presentation is most consistent with acute presentation with potential threat to life or bodily function.  Patient presents with shortness of breath, wheezing as detailed above consistent with asthma exacerbation.  Review of records demonstrates admission on October 27 at [facility] for asthma/respiratory failure  She reports she thinks symptoms are not as severe as then today, will give additional nebulizer, she is on the cardiac monitor, will watch carefully  ----------------------------------------- 11:07 PM on 08/04/2023 ----------------------------------------- On reeval patient is doing significantly better still with some scattered wheezes on exam, will give additional DuoNeb, she reports feeling much improved but still  wheezy.  Will asked my colleague to reevaluate to determine whether admission is necessary after additional neb      FINAL CLINICAL IMPRESSION(S) / ED DIAGNOSES   Final diagnoses:  Exacerbation of asthma, unspecified asthma severity, unspecified whether persistent     Rx / DC Orders   ED Discharge Orders     None        Note:  This document was prepared using Dragon voice recognition software and may include unintentional dictation errors.   Jene Every, MD 08/04/23 2308

## 2023-08-04 NOTE — ED Triage Notes (Signed)
Pt arrives via [facility] for asthma. *5% on RA when EMS found pt. Given 2 duonebs, 125 of solumedrol, 2 g of magnesium, and 1 albuterol treatment by EMS.   EMS vitals: 110 HR

## 2023-08-05 MED ORDER — PREDNISONE 50 MG PO TABS: ORAL_TABLET | ORAL | 0 refills | Status: AC

## 2023-08-05 NOTE — Discharge Instructions (Signed)
Use your albuterol inhaler 2 puffs every 4-6 hours as needed for shortness of breath or wheezing.  Take prednisone as prescribed.

## 2023-08-05 NOTE — ED Provider Notes (Signed)
Patient reassessed breathing comfortably feels much better after nebulizer treatment.  Very eager to go home.  Plan is for discharge and steroid burst, albuterol inhalers at home, return with worsening.   Pilar Jarvis, MD 08/05/23 [phone]

## 2023-10-27 ENCOUNTER — Ambulatory Visit: Attending: Physician Assistant

## 2023-10-27 DIAGNOSIS — M6281 Muscle weakness (generalized): Secondary | ICD-10-CM | POA: Diagnosis present

## 2023-10-27 NOTE — Therapy (Signed)
 OUTPATIENT OCCUPATIONAL THERAPY ORTHO EVALUATION  Patient Name: Susan Maddox MRN: [id number] DOB:[birth year], 23 y.o., female Today's Date: 10/27/2023  PCP: [facility] REFERRING PROVIDER: Anita Barman  END OF SESSION:  OT End of Session - 10/27/23 1014     Visit Number 1    Number of Visits 1    OT Start Time 1015    OT Stop Time 1045    OT Time Calculation (min) 30 min    Activity Tolerance Patient tolerated treatment well    Behavior During Therapy WFL for tasks assessed/performed             Past Medical History:  Diagnosis Date   Asthma    History reviewed. No pertinent surgical history. Patient Active Problem List   Diagnosis Date Noted   Hypokalemia 04/23/2023   Environmental allergies 04/23/2023   Cigar smoker 04/23/2023   Vomiting 04/23/2023   Moderate persistent asthma with severe exacerbation 04/22/2023    ONSET DATE: 10/13/23  REFERRING DIAG: UE weakness  THERAPY DIAG:  Muscle weakness (generalized)  Rationale for Evaluation and Treatment: Rehabilitation  SUBJECTIVE:   SUBJECTIVE STATEMENT: Pt reports she has improved a lot since her coma. 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 Pt enjoys caring for her dogs. Pt accompanied by: self  PERTINENT HISTORY:  Susan Maddox is a 23 y.o. female with history of asthma recently admitted to [facility] for shortness of breath found to be in status asthmaticus and requiring intubation from 09/17/23-10/13/23. Discharged home with family.     PRECAUTIONS: None  RED FLAGS: None   WEIGHT BEARING RESTRICTIONS: No  PAIN:  Are you having pain? No  FALLS: Has patient fallen in last 6 months? No  LIVING ENVIRONMENT: Lives with: lives with their family Lives in: House/apartment Stairs: Yes: Internal: 6 steps; none  PLOF: Independent  PATIENT GOALS: return to work safely  NEXT MD VISIT: none  OBJECTIVE:  Note: Objective measures  were completed at Evaluation unless otherwise noted.  HAND DOMINANCE: Right  ADLs: WFL   UPPER EXTREMITY ROM:   WFL   UPPER EXTREMITY MMT:   WFL 5/5 grossly  HAND FUNCTION: Grip strength: Right: 67 lbs; Left: 62 lbs, Lateral pinch: Right: 16 lbs, Left: 19 lbs, and 3 point pinch: Right: 15 lbs, Left: 15 lbs  COORDINATION: 9 Hole Peg test: Right: 22 sec; Left: 24 sec  SENSATION: WFL  EDEMA: none  COGNITION: Overall cognitive status: Within functional limits for tasks assessed  OBSERVATIONS: normal appearing young woman, pleasant and well groomed   TREATMENT DATE: 10/27/23                                                                                                                            Therapeutic Exercise:  -Performed hand strengthening with blue theraputty. Good return demonstration of technique. Pt. worked on gross grip loop, lateral pinch, 3pt. pinch, gross digit extension, digit extension table  spread, digit abduction loop, single digit extension loop, thumb opposition, and lumbical ex.  -10 small beads provided and placed in putty for pt to remove to promote tip to tip pinch strength and B dexterity.    PATIENT EDUCATION: Education details: HEP Person educated: Patient Education method: Medical illustrator Education comprehension: verbalized understanding and returned demonstration  HOME EXERCISE PROGRAM: Blue theraputty   GOALS: Goals reviewed with patient? Yes  ASSESSMENT:  CLINICAL IMPRESSION: Patient is a 23 y.o. F who was seen today for occupational therapy evaluation for UE weakness following recent hospital stay where pt required intubation from 09/17/23-10/13/23. Pt was discharged home with family and reports initial generalized weakness has improved over the last week. Denies any deficits with I/ADLs however expresses concern with return to work. Strength and FMC testing all appear within functional limits. B tremor noted with handwriting  (100% legible) and typing (30 wpm, 98% accuracy) however does not impair completion of tasks. Pt endorses moderate anxiousness with return to work, appears pleasantly surprised by her results and educated on work modifications, energy conservation strategies, and HEP. Blue theraputty provided with good return demonstration of exercises. Do not anticipate the need for follow up OT sessions, will discharge from services.   PERFORMANCE DEFICITS: in functional skills including  tremors  and psychosocial skills including coping strategies.   IMPAIRMENTS: are limiting patient from work.   COMORBIDITIES: has no other co-morbidities that affects occupational performance. Patient will benefit from skilled OT to address above impairments and improve overall function.  MODIFICATION OR ASSISTANCE TO COMPLETE EVALUATION: No modification of tasks or assist necessary to complete an evaluation.  OT OCCUPATIONAL PROFILE AND HISTORY: Problem focused assessment: Including review of records relating to presenting problem.  CLINICAL DECISION MAKING: LOW - limited treatment options, no task modification necessary  REHAB POTENTIAL: Good  EVALUATION COMPLEXITY: Low      PLAN:  OT FREQUENCY:  eval only  OT DURATION: other: eval only  PLANNED INTERVENTIONS: patient/family education  RECOMMENDED OTHER SERVICES: none  CONSULTED AND AGREED WITH PLAN OF CARE: Patient and parent  PLAN FOR NEXT SESSION: none  Gordan Latina, M.S. OTR/L  10/27/23, 10:57 AM  ascom [phone]

## 2023-11-27 ENCOUNTER — Ambulatory Visit
Admission: RE | Admit: 2023-11-27 | Discharge: 2023-11-27 | Disposition: A | Discharge status: Home / Self Care | Payer: Self-pay | Source: Ambulatory Visit | Attending: Emergency Medicine | Admitting: Emergency Medicine

## 2023-11-27 VITALS — BP 140/78 | HR 62 | Temp 98.9°F | Resp 15

## 2023-11-27 DIAGNOSIS — B370 Candidal stomatitis: Secondary | ICD-10-CM

## 2023-11-27 MED ORDER — NYSTATIN 100000 UNIT/ML MT SUSP: 500000.0000 [IU] | Freq: Four times a day (QID) | OROMUCOSAL | 0 refills | Status: AC

## 2023-11-27 NOTE — ED Triage Notes (Signed)
 Reoccurring sore tongue pain (towards the back) maybe thrush from current medication   Patient has been using an inhaler she thinks is making her tongue sore.

## 2023-11-27 NOTE — Discharge Instructions (Addendum)
 Swish and spit water and brush your tongue/teeth after using the Symbicort.  This will help prevent further episodes of thrush.

## 2023-11-27 NOTE — ED Provider Notes (Signed)
 HPI  SUBJECTIVE:  Susan Maddox is a 23 y.o. female who presents with 2 weeks of a sore tongue, sore throat, pain with swallowing.  She uses Symbicort, and usually rinses her mouth out after using it, but recently missed a few days.  Denies white coating, tongue swelling, fevers, uvular swelling, itching or burning of the mouth.  She tried ibuprofen  with improvement in her symptoms.  Symptoms are worse with swallowing.  She has past medical history of asthma.  No history of diabetes.    Past Medical History:  Diagnosis Date   Asthma     History reviewed. No pertinent surgical history.  History reviewed. No pertinent family history.  Social History   Tobacco Use   Smoking status: Some Days    Types: Cigars   Smokeless tobacco: Never  Vaping Use   Vaping status: Former  Substance Use Topics   Alcohol use: Not Currently   Drug use: Not Currently    Types: Marijuana    No current facility-administered medications for this encounter.  Current Outpatient Medications:    budesonide-formoterol (SYMBICORT) 160-4.5 MCG/ACT inhaler, Inhale 2 puffs into the lungs., Disp: , Rfl:    nystatin  (MYCOSTATIN ) 100000 UNIT/ML suspension, Take 5 mLs (500,000 Units total) by mouth 4 (four) times daily. Swish and swallow x 7-14 days. Retain in mouth as long as possible, Disp: 280 mL, Rfl: 0   albuterol  (PROVENTIL ) (2.5 MG/3ML) 0.083% nebulizer solution, Take 2.5 mg by nebulization every 6 (six) hours as needed for wheezing or shortness of breath., Disp: , Rfl:    fluticasone -salmeterol (ADVAIR HFA) 45-21 MCG/ACT inhaler, Inhale 2 puffs into the lungs 2 (two) times daily., Disp: 1 each, Rfl: 12   guaiFENesin  (MUCINEX ) 600 MG 12 hr tablet, Take 1 tablet (600 mg total) by mouth 2 (two) times daily. (Patient not taking: Reported on 10/27/2023), Disp: 10 tablet, Rfl: 0   predniSONE  (DELTASONE ) 50 MG tablet, Take 1 tablet daily for the next 5 days. (Patient not taking: Reported on 10/27/2023), Disp: 5  tablet, Rfl: 0   VENTOLIN  HFA 108 (90 Base) MCG/ACT inhaler, Inhale 2-4 puffs into the lungs every 4 (four) hours as needed for wheezing or shortness of breath., Disp: , Rfl:   No Known Allergies   ROS  As noted in HPI.   Physical Exam  BP (!) 140/78 (BP Location: Left Arm)   Pulse 62   Temp 98.9 F (37.2 C) (Oral)   Resp 15   SpO2 100%   Constitutional: Well developed, well nourished, no acute distress Eyes:  EOMI, conjunctiva normal bilaterally HENT: Normocephalic, atraumatic,mucus membranes moist.  White coating on tongue.  Uvula midline.  Normal oropharynx.  No white coating/plaques on buccal mucosa. Neck: No cervical lymphadenopathy Respiratory: Normal inspiratory effort Cardiovascular: Normal rate GI: nondistended skin: No rash, skin intact Musculoskeletal: no deformities Neurologic: Alert & oriented x 3, no focal neuro deficits Psychiatric: Speech and behavior appropriate   ED Course   Medications - No data to display  No orders of the defined types were placed in this encounter.   No results found for this or any previous visit (from the past 24 hours). No results found.  ED Clinical Impression  1. Thrush      ED Assessment/Plan     Suspect thrush from the Symbicort.  Advised her to swish spit, and brush tongue after using Symbicort.  Will send home with nystatin  swish and swallow.  Follow-up with PCP as needed.  Discussed  MDM, treatment plan,  and plan for follow-up with patient. . patient agrees with plan.   Meds ordered this encounter  Medications   nystatin  (MYCOSTATIN ) 100000 UNIT/ML suspension    Sig: Take 5 mLs (500,000 Units total) by mouth 4 (four) times daily. Swish and swallow x 7-14 days. Retain in mouth as long as possible    Dispense:  280 mL    Refill:  0      *This clinic note was created using Scientist, clinical (histocompatibility and immunogenetics). Therefore, there may be occasional mistakes despite careful proofreading.  ?    Ethlyn Herd,  MD 11/28/23 [phone]

## 2024-01-11 NOTE — Progress Notes (Signed)
 No show

## 2024-01-15 ENCOUNTER — Ambulatory Visit: Payer: Self-pay | Admitting: Internal Medicine

## 2024-03-21 DIAGNOSIS — R55 Syncope and collapse: Secondary | ICD-10-CM | POA: Diagnosis not present

## 2024-03-21 DIAGNOSIS — Y280XXA Contact with sharp glass, undetermined intent, initial encounter: Secondary | ICD-10-CM | POA: Diagnosis not present

## 2024-03-21 DIAGNOSIS — S56921A Laceration of unspecified muscles, fascia and tendons at forearm level, right arm, initial encounter: Secondary | ICD-10-CM | POA: Diagnosis not present

## 2024-03-21 DIAGNOSIS — R609 Edema, unspecified: Secondary | ICD-10-CM | POA: Diagnosis not present

## 2024-03-21 DIAGNOSIS — R110 Nausea: Secondary | ICD-10-CM | POA: Diagnosis not present

## 2024-03-21 DIAGNOSIS — Z743 Need for continuous supervision: Secondary | ICD-10-CM | POA: Diagnosis not present

## 2024-03-21 DIAGNOSIS — S51811A Laceration without foreign body of right forearm, initial encounter: Secondary | ICD-10-CM | POA: Diagnosis not present

## 2024-03-28 DIAGNOSIS — S51811A Laceration without foreign body of right forearm, initial encounter: Secondary | ICD-10-CM | POA: Diagnosis not present

## 2024-03-28 DIAGNOSIS — S5421XA Injury of radial nerve at forearm level, right arm, initial encounter: Secondary | ICD-10-CM | POA: Diagnosis not present

## 2024-04-02 DIAGNOSIS — S51811A Laceration without foreign body of right forearm, initial encounter: Secondary | ICD-10-CM | POA: Diagnosis not present

## 2024-04-02 DIAGNOSIS — S56529A Laceration of other extensor muscle, fascia and tendon at forearm level, unspecified arm, initial encounter: Secondary | ICD-10-CM | POA: Diagnosis not present

## 2024-04-02 DIAGNOSIS — S5421XA Injury of radial nerve at forearm level, right arm, initial encounter: Secondary | ICD-10-CM | POA: Diagnosis not present

## 2024-04-02 DIAGNOSIS — F1290 Cannabis use, unspecified, uncomplicated: Secondary | ICD-10-CM | POA: Diagnosis not present

## 2024-04-02 DIAGNOSIS — Z7951 Long term (current) use of inhaled steroids: Secondary | ICD-10-CM | POA: Diagnosis not present

## 2024-04-02 DIAGNOSIS — N189 Chronic kidney disease, unspecified: Secondary | ICD-10-CM | POA: Diagnosis not present

## 2024-04-02 DIAGNOSIS — S56521A Laceration of other extensor muscle, fascia and tendon at forearm level, right arm, initial encounter: Secondary | ICD-10-CM | POA: Diagnosis not present

## 2024-04-02 DIAGNOSIS — J4550 Severe persistent asthma, uncomplicated: Secondary | ICD-10-CM | POA: Diagnosis not present

## 2024-04-02 DIAGNOSIS — I129 Hypertensive chronic kidney disease with stage 1 through stage 4 chronic kidney disease, or unspecified chronic kidney disease: Secondary | ICD-10-CM | POA: Diagnosis not present

## 2024-04-02 DIAGNOSIS — Z87891 Personal history of nicotine dependence: Secondary | ICD-10-CM | POA: Diagnosis not present

## 2024-04-02 DIAGNOSIS — S56221A Laceration of other flexor muscle, fascia and tendon at forearm level, right arm, initial encounter: Secondary | ICD-10-CM | POA: Diagnosis not present

## 2024-04-18 DIAGNOSIS — S51811A Laceration without foreign body of right forearm, initial encounter: Secondary | ICD-10-CM | POA: Diagnosis not present

## 2024-04-18 DIAGNOSIS — M25641 Stiffness of right hand, not elsewhere classified: Secondary | ICD-10-CM | POA: Diagnosis not present

## 2024-04-18 DIAGNOSIS — Z789 Other specified health status: Secondary | ICD-10-CM | POA: Diagnosis not present

## 2024-04-18 DIAGNOSIS — R531 Weakness: Secondary | ICD-10-CM | POA: Diagnosis not present

## 2024-04-18 DIAGNOSIS — Z9889 Other specified postprocedural states: Secondary | ICD-10-CM | POA: Diagnosis not present

## 2024-04-23 DIAGNOSIS — Z9889 Other specified postprocedural states: Secondary | ICD-10-CM | POA: Diagnosis not present

## 2024-04-23 DIAGNOSIS — Z789 Other specified health status: Secondary | ICD-10-CM | POA: Diagnosis not present

## 2024-04-23 DIAGNOSIS — S51811A Laceration without foreign body of right forearm, initial encounter: Secondary | ICD-10-CM | POA: Diagnosis not present

## 2024-04-23 DIAGNOSIS — M25641 Stiffness of right hand, not elsewhere classified: Secondary | ICD-10-CM | POA: Diagnosis not present

## 2024-04-26 DIAGNOSIS — J45909 Unspecified asthma, uncomplicated: Secondary | ICD-10-CM | POA: Diagnosis not present

## 2024-04-26 DIAGNOSIS — Z7951 Long term (current) use of inhaled steroids: Secondary | ICD-10-CM | POA: Diagnosis not present

## 2024-04-26 DIAGNOSIS — Z79899 Other long term (current) drug therapy: Secondary | ICD-10-CM | POA: Diagnosis not present

## 2024-04-26 DIAGNOSIS — R0600 Dyspnea, unspecified: Secondary | ICD-10-CM | POA: Diagnosis not present

## 2024-04-26 DIAGNOSIS — Z87891 Personal history of nicotine dependence: Secondary | ICD-10-CM | POA: Diagnosis not present

## 2024-04-26 DIAGNOSIS — Z20822 Contact with and (suspected) exposure to covid-19: Secondary | ICD-10-CM | POA: Diagnosis not present

## 2024-04-27 DIAGNOSIS — J45909 Unspecified asthma, uncomplicated: Secondary | ICD-10-CM | POA: Diagnosis not present

## 2024-05-01 DIAGNOSIS — R531 Weakness: Secondary | ICD-10-CM | POA: Diagnosis not present

## 2024-05-01 DIAGNOSIS — Z9889 Other specified postprocedural states: Secondary | ICD-10-CM | POA: Diagnosis not present

## 2024-05-01 DIAGNOSIS — S51811A Laceration without foreign body of right forearm, initial encounter: Secondary | ICD-10-CM | POA: Diagnosis not present

## 2024-05-01 DIAGNOSIS — M25641 Stiffness of right hand, not elsewhere classified: Secondary | ICD-10-CM | POA: Diagnosis not present

## 2024-05-01 DIAGNOSIS — Z789 Other specified health status: Secondary | ICD-10-CM | POA: Diagnosis not present

## 2024-05-08 DIAGNOSIS — S51811A Laceration without foreign body of right forearm, initial encounter: Secondary | ICD-10-CM | POA: Diagnosis not present

## 2024-05-08 DIAGNOSIS — R531 Weakness: Secondary | ICD-10-CM | POA: Diagnosis not present

## 2024-05-08 DIAGNOSIS — Z9889 Other specified postprocedural states: Secondary | ICD-10-CM | POA: Diagnosis not present

## 2024-05-08 DIAGNOSIS — Z789 Other specified health status: Secondary | ICD-10-CM | POA: Diagnosis not present

## 2024-05-08 DIAGNOSIS — M25641 Stiffness of right hand, not elsewhere classified: Secondary | ICD-10-CM | POA: Diagnosis not present

## 2024-05-15 DIAGNOSIS — S51811A Laceration without foreign body of right forearm, initial encounter: Secondary | ICD-10-CM | POA: Diagnosis not present

## 2024-05-15 DIAGNOSIS — M25641 Stiffness of right hand, not elsewhere classified: Secondary | ICD-10-CM | POA: Diagnosis not present

## 2024-05-15 DIAGNOSIS — Z789 Other specified health status: Secondary | ICD-10-CM | POA: Diagnosis not present

## 2024-05-15 DIAGNOSIS — Z9889 Other specified postprocedural states: Secondary | ICD-10-CM | POA: Diagnosis not present

## 2024-05-15 DIAGNOSIS — R531 Weakness: Secondary | ICD-10-CM | POA: Diagnosis not present

## 2024-05-22 DIAGNOSIS — S51811A Laceration without foreign body of right forearm, initial encounter: Secondary | ICD-10-CM | POA: Diagnosis not present

## 2024-05-22 DIAGNOSIS — Z9889 Other specified postprocedural states: Secondary | ICD-10-CM | POA: Diagnosis not present

## 2024-05-22 DIAGNOSIS — Z789 Other specified health status: Secondary | ICD-10-CM | POA: Diagnosis not present

## 2024-05-22 DIAGNOSIS — R531 Weakness: Secondary | ICD-10-CM | POA: Diagnosis not present

## 2024-05-22 DIAGNOSIS — M25641 Stiffness of right hand, not elsewhere classified: Secondary | ICD-10-CM | POA: Diagnosis not present

## 2024-06-04 DIAGNOSIS — Z789 Other specified health status: Secondary | ICD-10-CM | POA: Diagnosis not present

## 2024-06-04 DIAGNOSIS — R531 Weakness: Secondary | ICD-10-CM | POA: Diagnosis not present

## 2024-06-04 DIAGNOSIS — M25641 Stiffness of right hand, not elsewhere classified: Secondary | ICD-10-CM | POA: Diagnosis not present

## 2024-06-04 DIAGNOSIS — S51811A Laceration without foreign body of right forearm, initial encounter: Secondary | ICD-10-CM | POA: Diagnosis not present

## 2024-06-04 DIAGNOSIS — Z9889 Other specified postprocedural states: Secondary | ICD-10-CM | POA: Diagnosis not present

## 2024-06-10 DIAGNOSIS — Z789 Other specified health status: Secondary | ICD-10-CM | POA: Diagnosis not present

## 2024-06-10 DIAGNOSIS — S51811A Laceration without foreign body of right forearm, initial encounter: Secondary | ICD-10-CM | POA: Diagnosis not present

## 2024-06-10 DIAGNOSIS — R531 Weakness: Secondary | ICD-10-CM | POA: Diagnosis not present

## 2024-06-10 DIAGNOSIS — Z9889 Other specified postprocedural states: Secondary | ICD-10-CM | POA: Diagnosis not present

## 2024-06-10 DIAGNOSIS — M25641 Stiffness of right hand, not elsewhere classified: Secondary | ICD-10-CM | POA: Diagnosis not present
